# Patient Record
Sex: Female | Born: 1975
Health system: Southern US, Community
[De-identification: ages and names within clinical notes are randomized; demographics above are authoritative.]

---

## 2002-11-25 ENCOUNTER — Emergency Department (HOSPITAL_COMMUNITY): Admission: EM | Admit: 2002-11-25 | Discharge: 2002-11-25 | Payer: Self-pay | Admitting: Emergency Medicine

## 2006-01-25 ENCOUNTER — Inpatient Hospital Stay (HOSPITAL_COMMUNITY): Admission: AD | Admit: 2006-01-25 | Discharge: 2006-01-27 | Payer: Self-pay | Admitting: Obstetrics & Gynecology

## 2007-01-23 ENCOUNTER — Encounter: Admission: RE | Admit: 2007-01-23 | Discharge: 2007-01-23 | Payer: Self-pay | Admitting: Obstetrics and Gynecology

## 2008-08-12 ENCOUNTER — Encounter: Admission: RE | Admit: 2008-08-12 | Discharge: 2008-08-12 | Payer: Self-pay | Admitting: Family Medicine

## 2009-07-29 ENCOUNTER — Encounter: Admission: RE | Admit: 2009-07-29 | Discharge: 2009-07-29 | Payer: Self-pay | Admitting: Obstetrics and Gynecology

## 2010-03-16 ENCOUNTER — Inpatient Hospital Stay (HOSPITAL_COMMUNITY): Admission: AD | Admit: 2010-03-16 | Discharge: 2010-03-16 | Payer: Self-pay | Admitting: Obstetrics and Gynecology

## 2010-03-29 ENCOUNTER — Inpatient Hospital Stay (HOSPITAL_COMMUNITY): Admission: AD | Admit: 2010-03-29 | Discharge: 2010-03-31 | Payer: Self-pay | Admitting: Obstetrics and Gynecology

## 2011-01-09 LAB — CBC
HCT: 34.3 % — ABNORMAL LOW (ref 36.0–46.0)
MCHC: 33.8 g/dL (ref 30.0–36.0)
MCHC: 34.5 g/dL (ref 30.0–36.0)
MCV: 87.7 fL (ref 78.0–100.0)
MCV: 88.9 fL (ref 78.0–100.0)
Platelets: 192 10*3/uL (ref 150–400)
RBC: 3.55 MIL/uL — ABNORMAL LOW (ref 3.87–5.11)
RBC: 3.92 MIL/uL (ref 3.87–5.11)
RDW: 13.8 % (ref 11.5–15.5)
WBC: 19.9 10*3/uL — ABNORMAL HIGH (ref 4.0–10.5)

## 2015-12-01 DIAGNOSIS — L309 Dermatitis, unspecified: Secondary | ICD-10-CM | POA: Diagnosis not present

## 2015-12-01 MED FILL — predniSONE 20 MG TABS: 20 | 7 days supply | Qty: 7 | Fill #0

## 2015-12-01 MED FILL — hydrOXYzine HCL 25 MG TABS: 25 | 10 days supply | Qty: 30 | Fill #0

## 2016-01-14 DIAGNOSIS — L2089 Other atopic dermatitis: Secondary | ICD-10-CM | POA: Diagnosis not present

## 2016-01-14 MED FILL — HYDROCORTISONE VAL 0.2% OIN: 0.2 | 14 days supply | Qty: 60 | Fill #0

## 2016-03-07 DIAGNOSIS — E559 Vitamin D deficiency, unspecified: Secondary | ICD-10-CM | POA: Diagnosis not present

## 2016-03-07 DIAGNOSIS — E7800 Pure hypercholesterolemia, unspecified: Secondary | ICD-10-CM | POA: Diagnosis not present

## 2016-03-07 DIAGNOSIS — Z0000 Encounter for general adult medical examination without abnormal findings: Secondary | ICD-10-CM | POA: Diagnosis not present

## 2016-03-16 DIAGNOSIS — F419 Anxiety disorder, unspecified: Secondary | ICD-10-CM | POA: Diagnosis not present

## 2016-03-16 DIAGNOSIS — Z0000 Encounter for general adult medical examination without abnormal findings: Secondary | ICD-10-CM | POA: Diagnosis not present

## 2016-03-27 MED FILL — predniSONE 20 MG TABS: 20 | 7 days supply | Qty: 7 | Fill #0

## 2016-04-14 MED FILL — ALPRAZolam 0.25 MG TABS: 0.25 | 30 days supply | Qty: 90 | Fill #0

## 2016-06-23 DIAGNOSIS — Z6827 Body mass index (BMI) 27.0-27.9, adult: Secondary | ICD-10-CM | POA: Diagnosis not present

## 2016-06-23 DIAGNOSIS — Z124 Encounter for screening for malignant neoplasm of cervix: Secondary | ICD-10-CM | POA: Diagnosis not present

## 2016-06-23 DIAGNOSIS — Z01419 Encounter for gynecological examination (general) (routine) without abnormal findings: Secondary | ICD-10-CM | POA: Diagnosis not present

## 2016-06-23 MED FILL — ESCITALOPRAM 10 MG TABLET: 10 | 30 days supply | Qty: 30 | Fill #0

## 2016-06-28 ENCOUNTER — Encounter: Payer: Self-pay | Admitting: Genetic Counselor

## 2016-06-28 ENCOUNTER — Telehealth: Payer: Self-pay | Admitting: Genetic Counselor

## 2016-06-28 NOTE — Telephone Encounter (Signed)
Pt confirmed appt, completed intake, aware of mychart questionnaire, mailed pt letter, faxed referring provider appt date/time.

## 2016-08-02 ENCOUNTER — Encounter: Payer: 59 | Admitting: Genetic Counselor

## 2016-08-02 ENCOUNTER — Other Ambulatory Visit: Payer: 59

## 2016-08-25 ENCOUNTER — Telehealth: Payer: Self-pay | Admitting: Genetic Counselor

## 2016-08-25 NOTE — Telephone Encounter (Signed)
Returned pt call regarding scheduling appt for genetics.  Lt vm

## 2016-09-04 MED FILL — predniSONE 20 MG TABS: 20 | 7 days supply | Qty: 7 | Fill #0

## 2016-09-04 MED FILL — ALPRAZolam 0.25 MG TABS: 0.25 | 30 days supply | Qty: 90 | Fill #1

## 2016-10-04 ENCOUNTER — Other Ambulatory Visit: Payer: Self-pay | Admitting: Obstetrics and Gynecology

## 2016-10-04 DIAGNOSIS — Z1231 Encounter for screening mammogram for malignant neoplasm of breast: Secondary | ICD-10-CM

## 2016-11-09 ENCOUNTER — Ambulatory Visit: Payer: 59

## 2016-11-30 ENCOUNTER — Ambulatory Visit
Admission: RE | Admit: 2016-11-30 | Discharge: 2016-11-30 | Disposition: A | Discharge status: Home / Self Care | Payer: 59 | Source: Ambulatory Visit | Attending: Obstetrics and Gynecology | Admitting: Obstetrics and Gynecology

## 2016-11-30 DIAGNOSIS — Z1231 Encounter for screening mammogram for malignant neoplasm of breast: Secondary | ICD-10-CM | POA: Diagnosis not present

## 2016-12-01 ENCOUNTER — Ambulatory Visit: Payer: 59

## 2017-02-01 MED FILL — ALPRAZolam 0.25 MG TABS: 0.25 | 30 days supply | Qty: 90 | Fill #0

## 2017-03-26 MED FILL — predniSONE 20 MG TABS: 20 | 7 days supply | Qty: 7 | Fill #0

## 2017-05-08 DIAGNOSIS — E559 Vitamin D deficiency, unspecified: Secondary | ICD-10-CM | POA: Diagnosis not present

## 2017-05-08 DIAGNOSIS — M2550 Pain in unspecified joint: Secondary | ICD-10-CM | POA: Diagnosis not present

## 2017-05-08 DIAGNOSIS — R5383 Other fatigue: Secondary | ICD-10-CM | POA: Diagnosis not present

## 2017-07-13 MED FILL — ALPRAZolam 0.25 MG TABS: 0.25 | 30 days supply | Qty: 90 | Fill #0

## 2017-09-26 DIAGNOSIS — Z01419 Encounter for gynecological examination (general) (routine) without abnormal findings: Secondary | ICD-10-CM | POA: Diagnosis not present

## 2017-11-15 ENCOUNTER — Other Ambulatory Visit: Payer: Self-pay | Admitting: Obstetrics

## 2017-11-15 DIAGNOSIS — Z1231 Encounter for screening mammogram for malignant neoplasm of breast: Secondary | ICD-10-CM

## 2017-11-20 MED FILL — ESCITALOPRAM 10 MG TABLET: 10 | 30 days supply | Qty: 30 | Fill #0

## 2017-12-07 ENCOUNTER — Ambulatory Visit
Admission: RE | Admit: 2017-12-07 | Discharge: 2017-12-07 | Disposition: A | Discharge status: Home / Self Care | Payer: 59 | Source: Ambulatory Visit | Attending: Obstetrics | Admitting: Obstetrics

## 2017-12-07 DIAGNOSIS — Z1231 Encounter for screening mammogram for malignant neoplasm of breast: Secondary | ICD-10-CM

## 2017-12-20 MED FILL — ALPRAZolam 0.25 MG TABS: 0.25 | 30 days supply | Qty: 90 | Fill #0

## 2018-01-30 MED FILL — ESCITALOPRAM 10 MG TABLET: 10 | 30 days supply | Qty: 30 | Fill #1

## 2018-03-01 MED FILL — predniSONE 20 MG TABS: 20 | 7 days supply | Qty: 7 | Fill #0

## 2018-04-16 MED FILL — ESCITALOPRAM 10 MG TABLET: 10 | 30 days supply | Qty: 30 | Fill #2

## 2018-04-17 DIAGNOSIS — Z0000 Encounter for general adult medical examination without abnormal findings: Secondary | ICD-10-CM | POA: Diagnosis not present

## 2018-04-17 DIAGNOSIS — E7800 Pure hypercholesterolemia, unspecified: Secondary | ICD-10-CM | POA: Diagnosis not present

## 2018-04-17 DIAGNOSIS — E559 Vitamin D deficiency, unspecified: Secondary | ICD-10-CM | POA: Diagnosis not present

## 2018-04-18 DIAGNOSIS — Z0000 Encounter for general adult medical examination without abnormal findings: Secondary | ICD-10-CM | POA: Diagnosis not present

## 2018-04-18 DIAGNOSIS — Z23 Encounter for immunization: Secondary | ICD-10-CM | POA: Diagnosis not present

## 2018-05-13 MED FILL — ALPRAZolam 0.25 MG TABS: 0.25 | 30 days supply | Qty: 90 | Fill #0

## 2018-06-14 MED FILL — predniSONE 20 MG TABS: 20 | 7 days supply | Qty: 7 | Fill #0

## 2018-06-25 MED FILL — ESCITALOPRAM 10 MG TABLET: 10 | 30 days supply | Qty: 30 | Fill #3

## 2018-08-29 MED FILL — ESCITALOPRAM 10 MG TABLET: 10 | 30 days supply | Qty: 30 | Fill #0

## 2018-09-12 MED FILL — ALPRAZolam 0.25 MG TABS: 0.25 | 30 days supply | Qty: 90 | Fill #0

## 2018-11-14 ENCOUNTER — Other Ambulatory Visit: Payer: Self-pay | Admitting: Obstetrics

## 2018-11-14 DIAGNOSIS — Z1231 Encounter for screening mammogram for malignant neoplasm of breast: Secondary | ICD-10-CM

## 2018-11-22 DIAGNOSIS — Z79899 Other long term (current) drug therapy: Secondary | ICD-10-CM | POA: Diagnosis not present

## 2018-11-22 DIAGNOSIS — M79604 Pain in right leg: Secondary | ICD-10-CM | POA: Diagnosis not present

## 2018-11-22 DIAGNOSIS — R202 Paresthesia of skin: Secondary | ICD-10-CM | POA: Diagnosis not present

## 2018-11-22 DIAGNOSIS — R635 Abnormal weight gain: Secondary | ICD-10-CM | POA: Diagnosis not present

## 2018-11-22 DIAGNOSIS — Z23 Encounter for immunization: Secondary | ICD-10-CM | POA: Diagnosis not present

## 2018-11-22 DIAGNOSIS — Z5181 Encounter for therapeutic drug level monitoring: Secondary | ICD-10-CM | POA: Diagnosis not present

## 2018-11-22 DIAGNOSIS — F419 Anxiety disorder, unspecified: Secondary | ICD-10-CM | POA: Diagnosis not present

## 2018-11-22 DIAGNOSIS — M79605 Pain in left leg: Secondary | ICD-10-CM | POA: Diagnosis not present

## 2018-11-29 DIAGNOSIS — Z01419 Encounter for gynecological examination (general) (routine) without abnormal findings: Secondary | ICD-10-CM | POA: Diagnosis not present

## 2018-11-29 MED FILL — ESCITALOPRAM 10 MG TABLET: 10 | 90 days supply | Qty: 90 | Fill #0

## 2018-11-29 MED FILL — LARIN 21 1-20 TABLET: 1-20 | 84 days supply | Qty: 84 | Fill #0

## 2018-12-05 DIAGNOSIS — Z0389 Encounter for observation for other suspected diseases and conditions ruled out: Secondary | ICD-10-CM | POA: Diagnosis not present

## 2018-12-05 DIAGNOSIS — M79661 Pain in right lower leg: Secondary | ICD-10-CM | POA: Diagnosis not present

## 2018-12-06 MED FILL — ALPRAZolam 0.25 MG TABS: 0.25 | 30 days supply | Qty: 30 | Fill #0 | Status: TO

## 2018-12-17 ENCOUNTER — Ambulatory Visit
Admission: RE | Admit: 2018-12-17 | Discharge: 2018-12-17 | Disposition: A | Discharge status: Home / Self Care | Payer: 59 | Source: Ambulatory Visit | Attending: Obstetrics | Admitting: Obstetrics

## 2018-12-17 DIAGNOSIS — Z1231 Encounter for screening mammogram for malignant neoplasm of breast: Secondary | ICD-10-CM | POA: Diagnosis not present

## 2019-01-31 DIAGNOSIS — J029 Acute pharyngitis, unspecified: Secondary | ICD-10-CM | POA: Diagnosis not present

## 2019-01-31 DIAGNOSIS — J069 Acute upper respiratory infection, unspecified: Secondary | ICD-10-CM | POA: Diagnosis not present

## 2019-02-03 MED FILL — ALPRAZolam 0.25 MG TABS: 0.25 | 30 days supply | Qty: 30 | Fill #0

## 2019-03-19 MED FILL — ALPRAZolam 0.25 MG TABS: 0.25 | 30 days supply | Qty: 30 | Fill #1

## 2019-04-14 DIAGNOSIS — Z0000 Encounter for general adult medical examination without abnormal findings: Secondary | ICD-10-CM | POA: Diagnosis not present

## 2019-04-23 DIAGNOSIS — F419 Anxiety disorder, unspecified: Secondary | ICD-10-CM | POA: Diagnosis not present

## 2019-04-23 DIAGNOSIS — Z0000 Encounter for general adult medical examination without abnormal findings: Secondary | ICD-10-CM | POA: Diagnosis not present

## 2019-04-23 DIAGNOSIS — Z7189 Other specified counseling: Secondary | ICD-10-CM | POA: Diagnosis not present

## 2019-04-23 DIAGNOSIS — E559 Vitamin D deficiency, unspecified: Secondary | ICD-10-CM | POA: Diagnosis not present

## 2019-04-23 MED FILL — ESCITALOPRAM 10 MG TABLET: 10 | 90 days supply | Qty: 180 | Fill #0

## 2019-06-06 MED FILL — ALPRAZolam 0.25 MG TABS: 0.25 | 30 days supply | Qty: 30 | Fill #0

## 2019-07-21 MED FILL — ALPRAZolam 0.25 MG TABS: 0.25 | 30 days supply | Qty: 30 | Fill #1

## 2019-09-11 MED FILL — ALPRAZolam 0.25 MG TABS: 0.25 | 30 days supply | Qty: 30 | Fill #0

## 2019-09-13 IMAGING — MG DIGITAL SCREENING BILATERAL MAMMOGRAM WITH TOMO AND CAD
8 series · 8 of 24 positions shown · non-contrast
Comparison: Previous exam(s).

CLINICAL DATA: Screening.

EXAM:
DIGITAL SCREENING BILATERAL MAMMOGRAM WITH TOMO AND CAD

[L CC synth-2D]
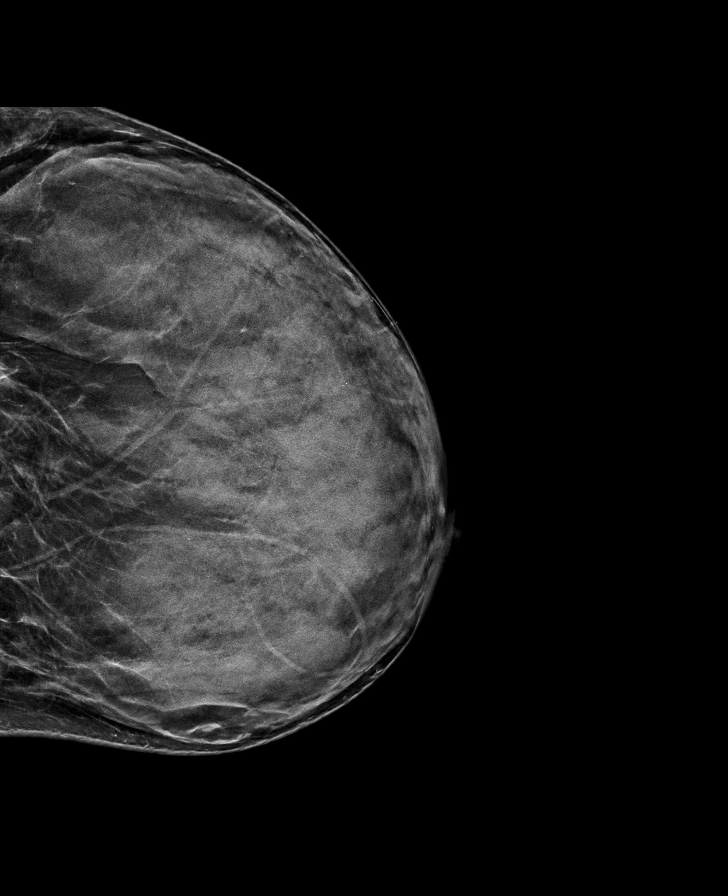

[R MLO synth-2D]
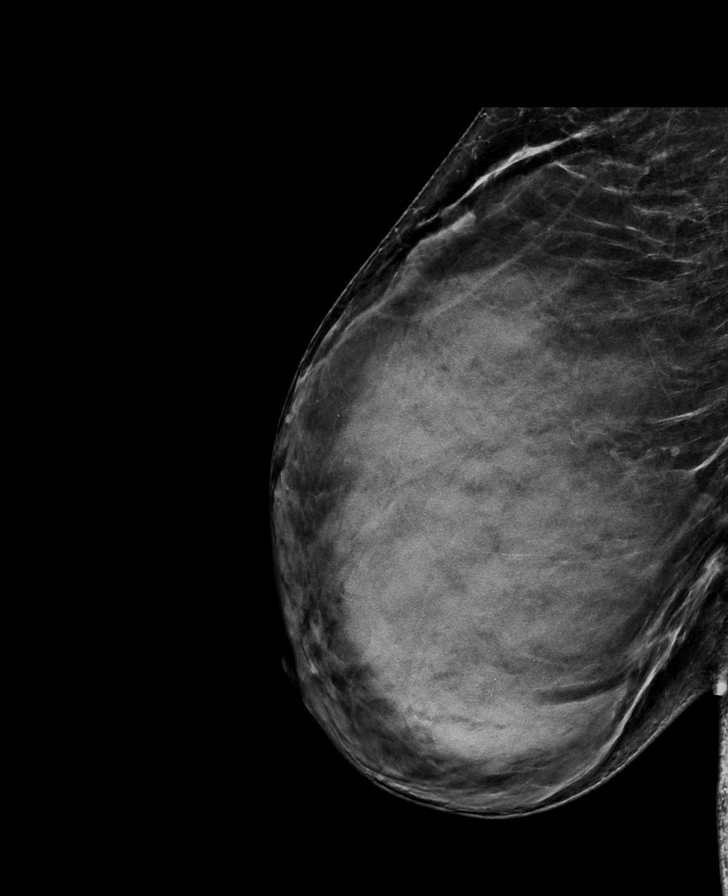

[L MLO synth-2D]
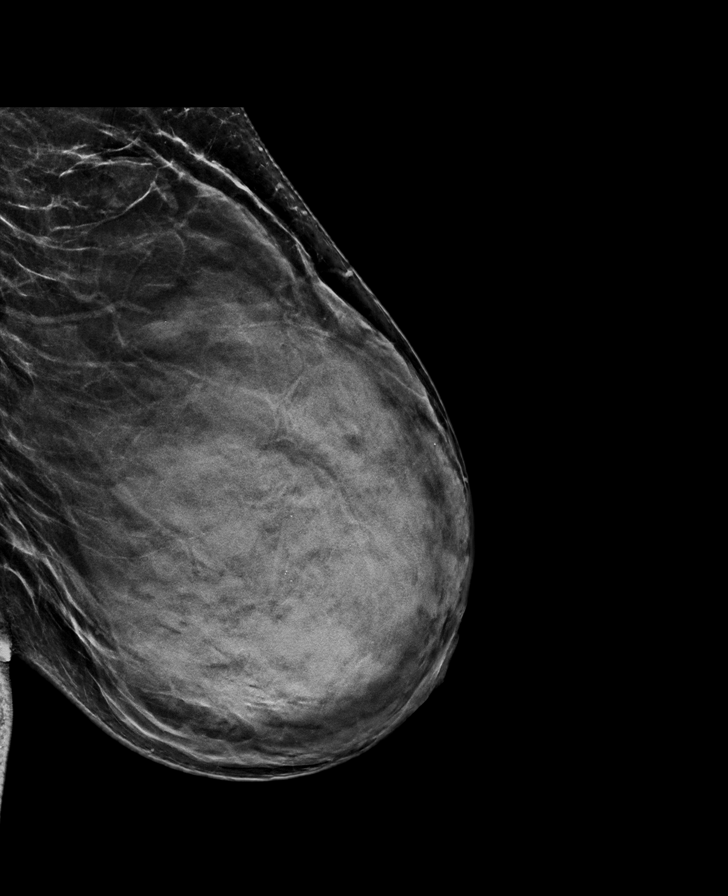

[R CC synth-2D]
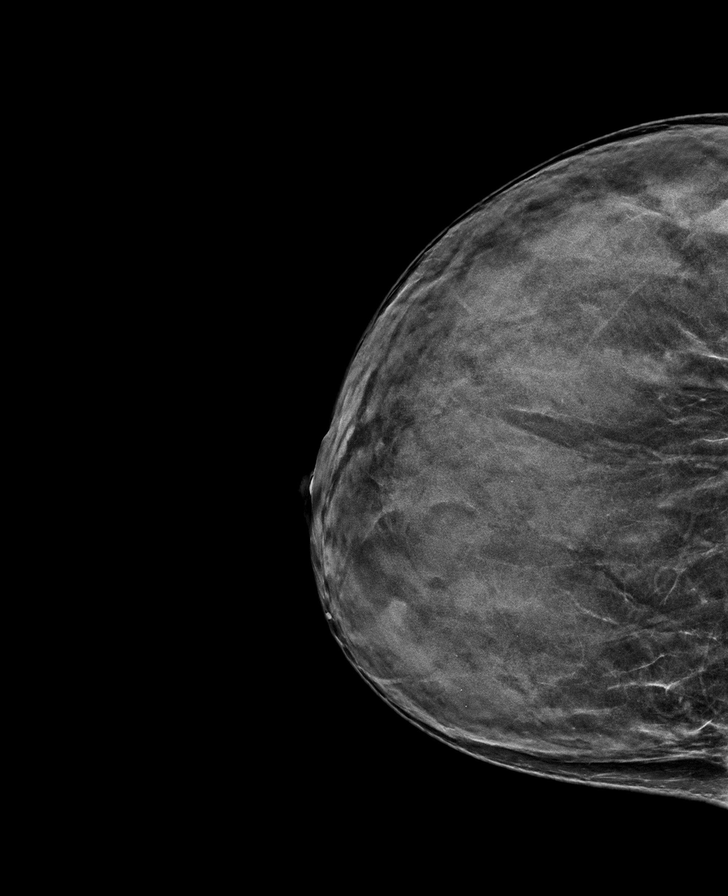

[L CC tomo · tomo slice 33/65.0]
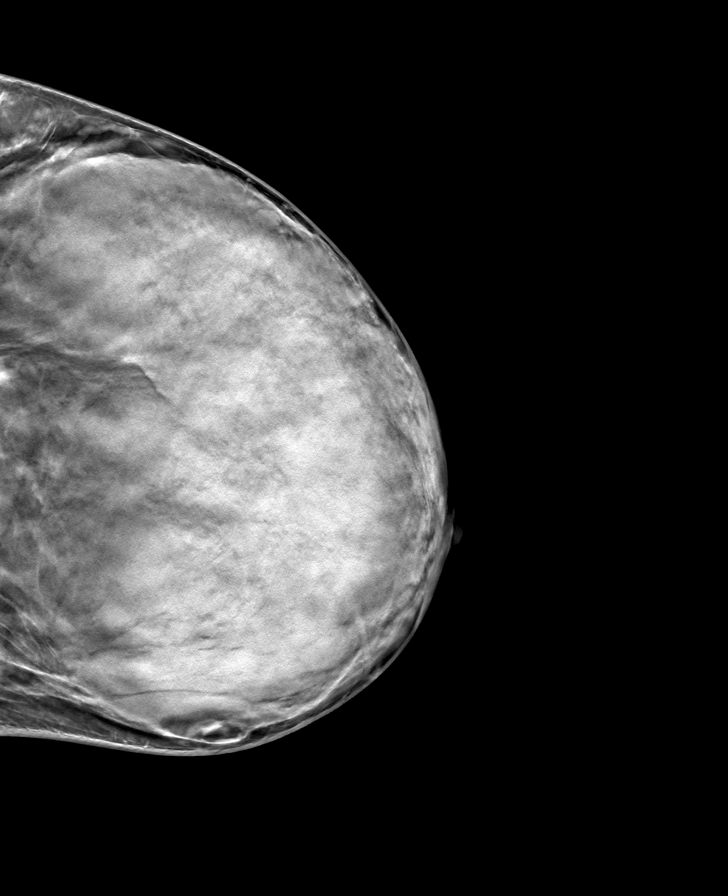

[R MLO tomo · tomo slice 39/76.0]
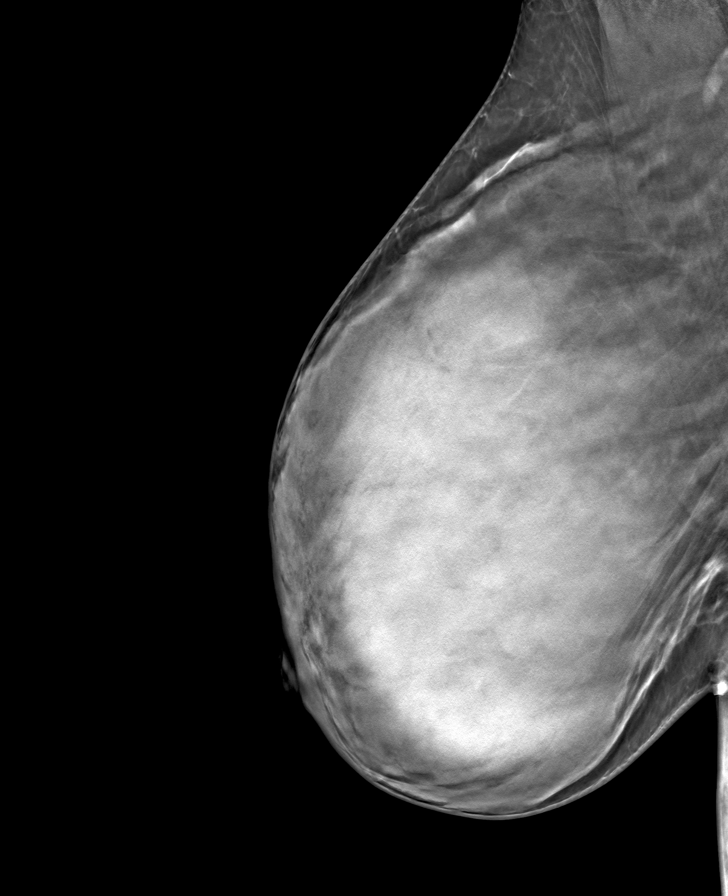

[R CC tomo · tomo slice 33/64.0]
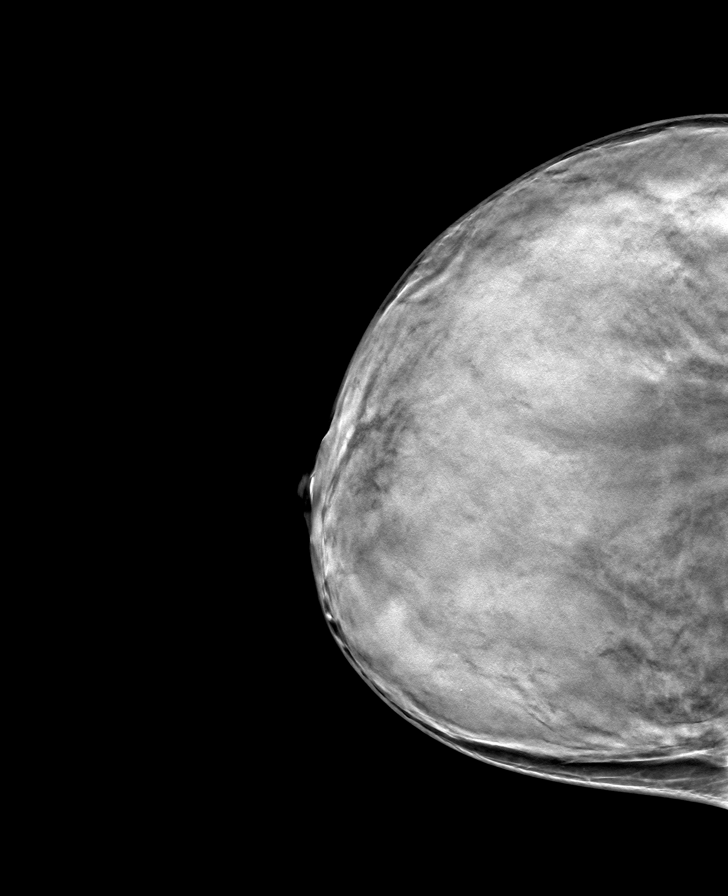

[L MLO tomo · tomo slice 35/69.0]
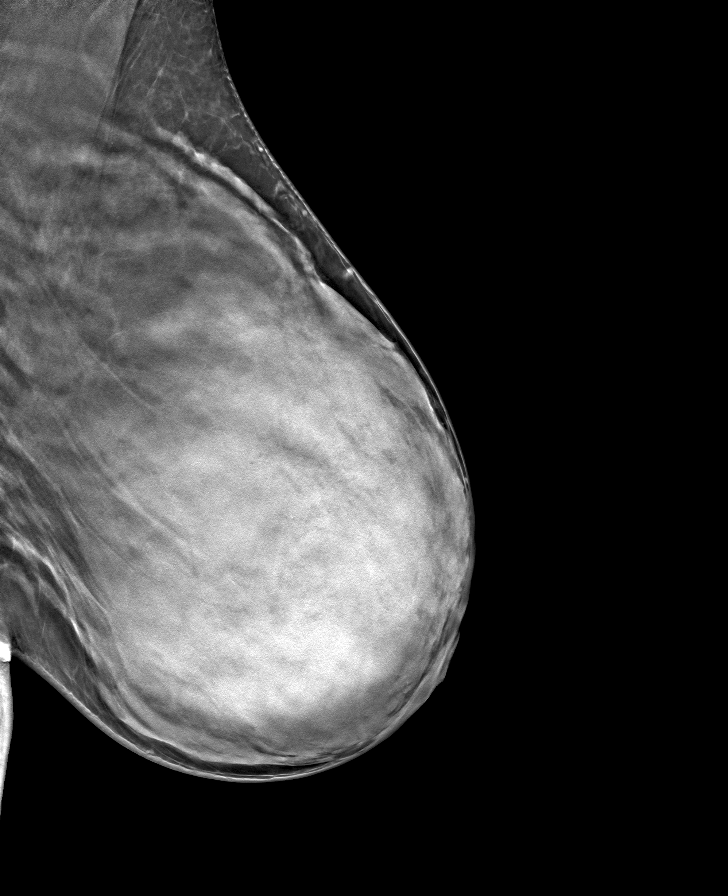

[8 of 24 positions shown; findings below may reference images not displayed]

ACR Breast Density Category d: The breast tissue is extremely dense,
which lowers the sensitivity of mammography
FINDINGS: There are no findings suspicious for malignancy. Images were
processed with CAD.
IMPRESSION: No mammographic evidence of malignancy. A result letter of this
screening mammogram will be mailed directly to the patient.

RECOMMENDATION:
Screening mammogram in one year. (Code:[id number])

BI-RADS CATEGORY  1: Negative.

## 2019-09-22 MED FILL — ALPRAZolam 0.25 MG TABS: 0.25 | 30 days supply | Qty: 30 | Fill #0

## 2019-10-09 DIAGNOSIS — M791 Myalgia, unspecified site: Secondary | ICD-10-CM | POA: Diagnosis not present

## 2019-10-14 DIAGNOSIS — M25511 Pain in right shoulder: Secondary | ICD-10-CM | POA: Diagnosis not present

## 2019-10-14 DIAGNOSIS — R202 Paresthesia of skin: Secondary | ICD-10-CM | POA: Diagnosis not present

## 2019-10-14 DIAGNOSIS — M791 Myalgia, unspecified site: Secondary | ICD-10-CM | POA: Diagnosis not present

## 2019-10-14 DIAGNOSIS — M542 Cervicalgia: Secondary | ICD-10-CM | POA: Diagnosis not present

## 2019-10-22 ENCOUNTER — Ambulatory Visit: Payer: 59 | Attending: Internal Medicine

## 2019-10-22 DIAGNOSIS — Z20822 Contact with and (suspected) exposure to covid-19: Secondary | ICD-10-CM

## 2019-10-22 DIAGNOSIS — Z20828 Contact with and (suspected) exposure to other viral communicable diseases: Secondary | ICD-10-CM | POA: Diagnosis not present

## 2019-10-23 LAB — NOVEL CORONAVIRUS, NAA: SARS-CoV-2, NAA: NOT DETECTED

## 2019-11-13 MED FILL — ESCITALOPRAM 10 MG TABLET: 10 | 90 days supply | Qty: 180 | Fill #1

## 2019-11-27 ENCOUNTER — Other Ambulatory Visit: Payer: Self-pay | Admitting: Obstetrics

## 2019-11-27 DIAGNOSIS — Z1231 Encounter for screening mammogram for malignant neoplasm of breast: Secondary | ICD-10-CM

## 2019-12-09 MED FILL — ALPRAZolam 0.25 MG TABS: 0.25 | 30 days supply | Qty: 30 | Fill #0

## 2019-12-25 ENCOUNTER — Ambulatory Visit: Payer: 59 | Attending: Family

## 2019-12-25 DIAGNOSIS — Z23 Encounter for immunization: Secondary | ICD-10-CM | POA: Insufficient documentation

## 2019-12-25 NOTE — Progress Notes (Signed)
   Covid-19 Vaccination Clinic  Name:  Bridget Kane    MRN: [id number] DOB: [birth year]  12/25/2019  Bridget Kane was observed post Covid-19 immunization for 15 minutes without incident. She was provided with Vaccine Information Sheet and instruction to access the V-Safe system.   Bridget Kane was instructed to call 911 with any severe reactions post vaccine: Marland Kitchen Difficulty breathing  . Swelling of face and throat  . A fast heartbeat  . A bad rash all over body  . Dizziness and weakness   Immunizations Administered    Name Date Dose VIS Date Route   Moderna COVID-19 Vaccine 12/25/2019  1:46 PM 0.5 mL 09/23/2019 Intramuscular   Manufacturer: Moderna   Lot: [id number]   [place]: [id number]

## 2020-01-06 ENCOUNTER — Ambulatory Visit
Admission: RE | Admit: 2020-01-06 | Discharge: 2020-01-06 | Disposition: A | Discharge status: Home / Self Care | Payer: 59 | Source: Ambulatory Visit | Attending: Obstetrics | Admitting: Obstetrics

## 2020-01-06 ENCOUNTER — Other Ambulatory Visit: Payer: Self-pay

## 2020-01-06 DIAGNOSIS — Z1231 Encounter for screening mammogram for malignant neoplasm of breast: Secondary | ICD-10-CM | POA: Diagnosis not present

## 2020-01-27 ENCOUNTER — Ambulatory Visit: Payer: 59 | Attending: Family

## 2020-01-27 DIAGNOSIS — Z23 Encounter for immunization: Secondary | ICD-10-CM

## 2020-01-27 NOTE — Progress Notes (Signed)
   Covid-19 Vaccination Clinic  Name:  Bridget Kane    MRN: [id number] DOB: [birth year]  01/27/2020  Bridget Kane was observed post Covid-19 immunization for 15 minutes without incident. She was provided with Vaccine Information Sheet and instruction to access the V-Safe system.   Bridget Kane was instructed to call 911 with any severe reactions post vaccine: Marland Kitchen Difficulty breathing  . Swelling of face and throat  . A fast heartbeat  . A bad rash all over body  . Dizziness and weakness   Immunizations Administered    Name Date Dose VIS Date Route   Moderna COVID-19 Vaccine 01/27/2020  9:38 AM 0.5 mL 09/23/2019 Intramuscular   Manufacturer: Moderna   Lot: [id number]   [place]: [id number]

## 2020-02-12 ENCOUNTER — Other Ambulatory Visit: Payer: Self-pay | Admitting: Obstetrics

## 2020-02-12 DIAGNOSIS — Z01419 Encounter for gynecological examination (general) (routine) without abnormal findings: Secondary | ICD-10-CM | POA: Diagnosis not present

## 2020-02-12 DIAGNOSIS — N6310 Unspecified lump in the right breast, unspecified quadrant: Secondary | ICD-10-CM

## 2020-02-24 ENCOUNTER — Other Ambulatory Visit: Payer: Self-pay

## 2020-02-24 ENCOUNTER — Ambulatory Visit
Admission: RE | Admit: 2020-02-24 | Discharge: 2020-02-24 | Disposition: A | Discharge status: Home / Self Care | Payer: 59 | Source: Ambulatory Visit | Attending: Obstetrics | Admitting: Obstetrics

## 2020-02-24 DIAGNOSIS — N6310 Unspecified lump in the right breast, unspecified quadrant: Secondary | ICD-10-CM

## 2020-02-24 DIAGNOSIS — R922 Inconclusive mammogram: Secondary | ICD-10-CM | POA: Diagnosis not present

## 2020-02-24 DIAGNOSIS — N6001 Solitary cyst of right breast: Secondary | ICD-10-CM | POA: Diagnosis not present

## 2020-05-25 MED FILL — ALPRAZolam 0.25 MG TABS: 0.25 | 30 days supply | Qty: 30 | Fill #0

## 2020-05-26 DIAGNOSIS — Z0000 Encounter for general adult medical examination without abnormal findings: Secondary | ICD-10-CM | POA: Diagnosis not present

## 2020-05-26 DIAGNOSIS — E559 Vitamin D deficiency, unspecified: Secondary | ICD-10-CM | POA: Diagnosis not present

## 2020-06-01 DIAGNOSIS — F419 Anxiety disorder, unspecified: Secondary | ICD-10-CM | POA: Diagnosis not present

## 2020-06-01 DIAGNOSIS — Z0000 Encounter for general adult medical examination without abnormal findings: Secondary | ICD-10-CM | POA: Diagnosis not present

## 2020-06-01 DIAGNOSIS — R7309 Other abnormal glucose: Secondary | ICD-10-CM | POA: Diagnosis not present

## 2020-06-01 DIAGNOSIS — R634 Abnormal weight loss: Secondary | ICD-10-CM | POA: Diagnosis not present

## 2020-06-01 DIAGNOSIS — E559 Vitamin D deficiency, unspecified: Secondary | ICD-10-CM | POA: Diagnosis not present

## 2020-07-15 MED FILL — predniSONE 20 MG TABS: 20 | 7 days supply | Qty: 7 | Fill #0

## 2020-07-22 MED FILL — ALPRAZolam 0.25 MG TABS: 0.25 | 30 days supply | Qty: 30 | Fill #0

## 2020-08-25 ENCOUNTER — Other Ambulatory Visit (HOSPITAL_COMMUNITY): Payer: Self-pay | Admitting: Family Medicine

## 2020-08-25 MED FILL — ESCITALOPRAM 10 MG TABLET: 10 | 90 days supply | Qty: 90 | Fill #0

## 2020-09-29 MED FILL — ALPRAZolam 0.25 MG TABS: 0.25 | 30 days supply | Qty: 30 | Fill #1

## 2020-10-27 ENCOUNTER — Other Ambulatory Visit (HOSPITAL_COMMUNITY): Payer: Self-pay | Admitting: Family Medicine

## 2020-10-27 DIAGNOSIS — J020 Streptococcal pharyngitis: Secondary | ICD-10-CM | POA: Diagnosis not present

## 2020-10-27 DIAGNOSIS — J029 Acute pharyngitis, unspecified: Secondary | ICD-10-CM | POA: Diagnosis not present

## 2020-10-27 MED FILL — AMOXICILLIN 875 MG TABS: 875 | 10 days supply | Qty: 20 | Fill #0

## 2020-12-29 ENCOUNTER — Other Ambulatory Visit (HOSPITAL_COMMUNITY): Payer: Self-pay | Admitting: Family Medicine

## 2020-12-29 MED FILL — ALPRAZolam 0.25 MG TABS: 0.25 | 30 days supply | Qty: 30 | Fill #0

## 2021-03-10 DIAGNOSIS — Z01419 Encounter for gynecological examination (general) (routine) without abnormal findings: Secondary | ICD-10-CM | POA: Diagnosis not present

## 2021-03-10 DIAGNOSIS — Z124 Encounter for screening for malignant neoplasm of cervix: Secondary | ICD-10-CM | POA: Diagnosis not present

## 2021-03-15 ENCOUNTER — Other Ambulatory Visit (HOSPITAL_COMMUNITY): Payer: Self-pay

## 2021-03-15 MED FILL — Alprazolam Tab 0.25 MG: ORAL | 30 days supply | Qty: 30 | Fill #0 | Status: AC

## 2021-03-18 ENCOUNTER — Other Ambulatory Visit (HOSPITAL_COMMUNITY): Payer: Self-pay

## 2021-03-25 DIAGNOSIS — Z23 Encounter for immunization: Secondary | ICD-10-CM | POA: Diagnosis not present

## 2021-03-28 ENCOUNTER — Other Ambulatory Visit (HOSPITAL_COMMUNITY): Payer: Self-pay

## 2021-03-28 MED ORDER — AMOXICILLIN 500 MG PO CAPS
ORAL_CAPSULE | ORAL | 1 refills | Status: AC
  Filled 2021-03-28: qty 30, 10d supply, fill #0

## 2021-04-15 ENCOUNTER — Other Ambulatory Visit (HOSPITAL_COMMUNITY): Payer: Self-pay

## 2021-04-15 MED FILL — Escitalopram Oxalate Tab 10 MG (Base Equiv): ORAL | 90 days supply | Qty: 90 | Fill #0 | Status: AC

## 2021-04-18 ENCOUNTER — Other Ambulatory Visit (HOSPITAL_COMMUNITY): Payer: Self-pay

## 2021-04-18 MED ORDER — ALPRAZOLAM 0.25 MG PO TABS
0.2500 mg | ORAL_TABLET | Freq: Every day | ORAL | 1 refills | Status: DC
  Filled 2021-04-18: qty 30, 30d supply, fill #0
  Filled 2021-07-05: qty 30, 30d supply, fill #1

## 2021-04-19 ENCOUNTER — Other Ambulatory Visit (HOSPITAL_COMMUNITY): Payer: Self-pay

## 2021-07-05 ENCOUNTER — Other Ambulatory Visit (HOSPITAL_COMMUNITY): Payer: Self-pay

## 2021-07-08 ENCOUNTER — Other Ambulatory Visit (HOSPITAL_COMMUNITY): Payer: Self-pay

## 2021-08-01 ENCOUNTER — Other Ambulatory Visit (HOSPITAL_COMMUNITY): Payer: Self-pay

## 2021-08-01 MED ORDER — ESCITALOPRAM OXALATE 10 MG PO TABS
20.0000 mg | ORAL_TABLET | Freq: Every day | ORAL | 0 refills | Status: DC
  Filled 2021-08-01: qty 180, 90d supply, fill #0

## 2021-08-23 ENCOUNTER — Other Ambulatory Visit (HOSPITAL_COMMUNITY): Payer: Self-pay

## 2021-09-09 DIAGNOSIS — R634 Abnormal weight loss: Secondary | ICD-10-CM | POA: Diagnosis not present

## 2021-09-09 DIAGNOSIS — R7309 Other abnormal glucose: Secondary | ICD-10-CM | POA: Diagnosis not present

## 2021-09-09 DIAGNOSIS — E559 Vitamin D deficiency, unspecified: Secondary | ICD-10-CM | POA: Diagnosis not present

## 2021-09-09 DIAGNOSIS — Z0000 Encounter for general adult medical examination without abnormal findings: Secondary | ICD-10-CM | POA: Diagnosis not present

## 2021-09-23 ENCOUNTER — Other Ambulatory Visit (HOSPITAL_COMMUNITY): Payer: Self-pay

## 2021-09-23 DIAGNOSIS — Z1211 Encounter for screening for malignant neoplasm of colon: Secondary | ICD-10-CM | POA: Diagnosis not present

## 2021-09-23 DIAGNOSIS — Z23 Encounter for immunization: Secondary | ICD-10-CM | POA: Diagnosis not present

## 2021-09-23 DIAGNOSIS — F419 Anxiety disorder, unspecified: Secondary | ICD-10-CM | POA: Diagnosis not present

## 2021-09-23 DIAGNOSIS — R7309 Other abnormal glucose: Secondary | ICD-10-CM | POA: Diagnosis not present

## 2021-09-23 DIAGNOSIS — Z0000 Encounter for general adult medical examination without abnormal findings: Secondary | ICD-10-CM | POA: Diagnosis not present

## 2021-09-23 DIAGNOSIS — E559 Vitamin D deficiency, unspecified: Secondary | ICD-10-CM | POA: Diagnosis not present

## 2021-09-23 DIAGNOSIS — R7989 Other specified abnormal findings of blood chemistry: Secondary | ICD-10-CM | POA: Diagnosis not present

## 2021-09-23 MED ORDER — ALPRAZOLAM 0.25 MG PO TABS
0.2500 mg | ORAL_TABLET | Freq: Every day | ORAL | 2 refills | Status: DC
  Filled 2021-09-23: qty 30, 30d supply, fill #0
  Filled 2021-11-25: qty 30, 30d supply, fill #1
  Filled 2022-02-17: qty 30, 30d supply, fill #2

## 2021-10-09 DIAGNOSIS — Z1211 Encounter for screening for malignant neoplasm of colon: Secondary | ICD-10-CM | POA: Diagnosis not present

## 2021-10-09 DIAGNOSIS — Z1212 Encounter for screening for malignant neoplasm of rectum: Secondary | ICD-10-CM | POA: Diagnosis not present

## 2021-11-25 ENCOUNTER — Other Ambulatory Visit (HOSPITAL_COMMUNITY): Payer: Self-pay

## 2021-12-27 ENCOUNTER — Other Ambulatory Visit: Payer: Self-pay | Admitting: Obstetrics

## 2021-12-27 DIAGNOSIS — Z1231 Encounter for screening mammogram for malignant neoplasm of breast: Secondary | ICD-10-CM

## 2022-01-06 ENCOUNTER — Ambulatory Visit
Admission: RE | Admit: 2022-01-06 | Discharge: 2022-01-06 | Disposition: A | Discharge status: Home / Self Care | Payer: 59 | Source: Ambulatory Visit | Attending: Obstetrics | Admitting: Obstetrics

## 2022-01-06 DIAGNOSIS — Z1231 Encounter for screening mammogram for malignant neoplasm of breast: Secondary | ICD-10-CM | POA: Diagnosis not present

## 2022-01-09 ENCOUNTER — Other Ambulatory Visit: Payer: Self-pay | Admitting: Obstetrics

## 2022-01-09 DIAGNOSIS — R928 Other abnormal and inconclusive findings on diagnostic imaging of breast: Secondary | ICD-10-CM

## 2022-01-16 ENCOUNTER — Ambulatory Visit
Admission: RE | Admit: 2022-01-16 | Discharge: 2022-01-16 | Disposition: A | Discharge status: Home / Self Care | Payer: 59 | Source: Ambulatory Visit | Attending: Obstetrics | Admitting: Obstetrics

## 2022-01-16 DIAGNOSIS — R922 Inconclusive mammogram: Secondary | ICD-10-CM | POA: Diagnosis not present

## 2022-01-16 DIAGNOSIS — R928 Other abnormal and inconclusive findings on diagnostic imaging of breast: Secondary | ICD-10-CM

## 2022-01-16 DIAGNOSIS — N6011 Diffuse cystic mastopathy of right breast: Secondary | ICD-10-CM | POA: Diagnosis not present

## 2022-02-17 ENCOUNTER — Other Ambulatory Visit (HOSPITAL_COMMUNITY): Payer: Self-pay

## 2022-02-17 MED ORDER — ESCITALOPRAM OXALATE 10 MG PO TABS
20.0000 mg | ORAL_TABLET | Freq: Every day | ORAL | 1 refills | Status: AC
  Filled 2022-02-17: qty 180, 90d supply, fill #0

## 2022-03-09 ENCOUNTER — Other Ambulatory Visit (HOSPITAL_COMMUNITY): Payer: Self-pay

## 2022-03-09 MED ORDER — PREDNISONE 20 MG PO TABS
20.0000 mg | ORAL_TABLET | Freq: Every day | ORAL | 0 refills | Status: AC
  Filled 2022-03-09: qty 7, 7d supply, fill #0

## 2022-03-10 DIAGNOSIS — Z01419 Encounter for gynecological examination (general) (routine) without abnormal findings: Secondary | ICD-10-CM | POA: Diagnosis not present

## 2022-04-07 ENCOUNTER — Other Ambulatory Visit (HOSPITAL_COMMUNITY): Payer: Self-pay

## 2022-04-07 DIAGNOSIS — K588 Other irritable bowel syndrome: Secondary | ICD-10-CM | POA: Diagnosis not present

## 2022-04-07 MED ORDER — DICYCLOMINE HCL 20 MG PO TABS
20.0000 mg | ORAL_TABLET | Freq: Four times a day (QID) | ORAL | 0 refills | Status: AC | PRN
  Filled 2022-04-07: qty 20, 5d supply, fill #0

## 2022-04-10 ENCOUNTER — Other Ambulatory Visit (HOSPITAL_COMMUNITY): Payer: Self-pay

## 2022-04-10 MED ORDER — ALPRAZOLAM 0.25 MG PO TABS
0.2500 mg | ORAL_TABLET | Freq: Every day | ORAL | 2 refills | Status: AC
  Filled 2022-04-10: qty 30, 30d supply, fill #0
  Filled 2022-06-07: qty 30, 30d supply, fill #1
  Filled 2022-07-25: qty 30, 30d supply, fill #2

## 2022-06-07 ENCOUNTER — Other Ambulatory Visit (HOSPITAL_COMMUNITY): Payer: Self-pay

## 2022-06-28 ENCOUNTER — Other Ambulatory Visit (HOSPITAL_COMMUNITY): Payer: Self-pay

## 2022-07-25 ENCOUNTER — Other Ambulatory Visit (HOSPITAL_COMMUNITY): Payer: Self-pay

## 2022-09-12 ENCOUNTER — Other Ambulatory Visit (HOSPITAL_COMMUNITY): Payer: Self-pay

## 2022-09-12 MED ORDER — ALPRAZOLAM 0.25 MG PO TABS
0.2500 mg | ORAL_TABLET | Freq: Every day | ORAL | 0 refills | Status: AC | PRN
Start: 2022-09-12 — End: ?
  Filled 2022-09-12: qty 30, 30d supply, fill #0

## 2022-09-19 DIAGNOSIS — R7309 Other abnormal glucose: Secondary | ICD-10-CM | POA: Diagnosis not present

## 2022-09-19 DIAGNOSIS — Z0000 Encounter for general adult medical examination without abnormal findings: Secondary | ICD-10-CM | POA: Diagnosis not present

## 2022-09-19 DIAGNOSIS — E559 Vitamin D deficiency, unspecified: Secondary | ICD-10-CM | POA: Diagnosis not present

## 2022-09-19 DIAGNOSIS — R634 Abnormal weight loss: Secondary | ICD-10-CM | POA: Diagnosis not present

## 2022-09-26 DIAGNOSIS — R7989 Other specified abnormal findings of blood chemistry: Secondary | ICD-10-CM | POA: Diagnosis not present

## 2022-09-26 DIAGNOSIS — R7309 Other abnormal glucose: Secondary | ICD-10-CM | POA: Diagnosis not present

## 2022-09-26 DIAGNOSIS — E559 Vitamin D deficiency, unspecified: Secondary | ICD-10-CM | POA: Diagnosis not present

## 2022-09-26 DIAGNOSIS — F419 Anxiety disorder, unspecified: Secondary | ICD-10-CM | POA: Diagnosis not present

## 2022-09-26 DIAGNOSIS — E7800 Pure hypercholesterolemia, unspecified: Secondary | ICD-10-CM | POA: Diagnosis not present

## 2022-09-26 DIAGNOSIS — Z23 Encounter for immunization: Secondary | ICD-10-CM | POA: Diagnosis not present

## 2022-09-26 DIAGNOSIS — Z0000 Encounter for general adult medical examination without abnormal findings: Secondary | ICD-10-CM | POA: Diagnosis not present

## 2022-09-26 DIAGNOSIS — Z79899 Other long term (current) drug therapy: Secondary | ICD-10-CM | POA: Diagnosis not present

## 2023-01-30 ENCOUNTER — Other Ambulatory Visit (HOSPITAL_COMMUNITY): Payer: Self-pay

## 2023-01-30 MED ORDER — ALPRAZOLAM 0.25 MG PO TABS
0.2500 mg | ORAL_TABLET | Freq: Every day | ORAL | 0 refills | Status: AC
  Filled 2023-01-30: qty 30, 30d supply, fill #0

## 2023-02-22 ENCOUNTER — Other Ambulatory Visit: Payer: Self-pay | Admitting: Obstetrics

## 2023-02-22 DIAGNOSIS — Z1231 Encounter for screening mammogram for malignant neoplasm of breast: Secondary | ICD-10-CM

## 2023-02-26 ENCOUNTER — Ambulatory Visit
Admission: RE | Admit: 2023-02-26 | Discharge: 2023-02-26 | Disposition: A | Discharge status: Home / Self Care | Payer: Commercial Managed Care - PPO | Source: Ambulatory Visit | Attending: Obstetrics | Admitting: Obstetrics

## 2023-02-26 DIAGNOSIS — Z1231 Encounter for screening mammogram for malignant neoplasm of breast: Secondary | ICD-10-CM | POA: Diagnosis not present

## 2023-04-12 ENCOUNTER — Other Ambulatory Visit (HOSPITAL_COMMUNITY): Payer: Self-pay

## 2023-04-12 MED ORDER — ESCITALOPRAM OXALATE 10 MG PO TABS
20.0000 mg | ORAL_TABLET | Freq: Every day | ORAL | 2 refills | Status: AC
  Filled 2023-04-12: qty 60, 30d supply, fill #0
  Filled 2023-08-21: qty 90, 45d supply, fill #0
  Filled 2024-04-11: qty 90, 45d supply, fill #1

## 2023-04-16 ENCOUNTER — Other Ambulatory Visit (HOSPITAL_COMMUNITY): Payer: Self-pay

## 2023-04-24 ENCOUNTER — Other Ambulatory Visit (HOSPITAL_COMMUNITY): Payer: Self-pay

## 2023-05-16 ENCOUNTER — Other Ambulatory Visit (HOSPITAL_COMMUNITY): Payer: Self-pay

## 2023-05-16 MED ORDER — ALPRAZOLAM 0.25 MG PO TABS
0.2500 mg | ORAL_TABLET | Freq: Every day | ORAL | 0 refills | Status: AC | PRN
  Filled 2023-05-16: qty 30, 30d supply, fill #0

## 2023-08-21 ENCOUNTER — Other Ambulatory Visit (HOSPITAL_COMMUNITY): Payer: Self-pay

## 2023-10-02 ENCOUNTER — Other Ambulatory Visit (HOSPITAL_COMMUNITY): Payer: Self-pay

## 2023-10-02 MED ORDER — ALPRAZOLAM 0.25 MG PO TABS
0.2500 mg | ORAL_TABLET | Freq: Every day | ORAL | 0 refills | Status: AC | PRN
  Filled 2023-10-02: qty 30, 30d supply, fill #0

## 2024-01-09 ENCOUNTER — Other Ambulatory Visit (HOSPITAL_COMMUNITY): Payer: Self-pay

## 2024-01-09 MED ORDER — ALPRAZOLAM 0.25 MG PO TABS
0.2500 mg | ORAL_TABLET | Freq: Every day | ORAL | 2 refills | Status: DC | PRN
  Filled 2024-01-09: qty 30, 30d supply, fill #0
  Filled 2024-03-03: qty 30, 30d supply, fill #1
  Filled 2024-04-24: qty 30, 30d supply, fill #2

## 2024-01-09 MED ORDER — AZITHROMYCIN 250 MG PO TABS
ORAL_TABLET | ORAL | 0 refills | Status: AC
  Filled 2024-01-09: qty 6, 5d supply, fill #0

## 2024-01-09 MED ORDER — PREDNISONE 50 MG PO TABS
50.0000 mg | ORAL_TABLET | Freq: Every day | ORAL | 0 refills | Status: AC
Start: 2024-01-09 — End: 2024-01-15
  Filled 2024-01-09: qty 5, 5d supply, fill #0

## 2024-03-03 ENCOUNTER — Other Ambulatory Visit (HOSPITAL_COMMUNITY): Payer: Self-pay

## 2024-03-06 ENCOUNTER — Other Ambulatory Visit: Payer: Self-pay | Admitting: Obstetrics

## 2024-03-06 DIAGNOSIS — Z1231 Encounter for screening mammogram for malignant neoplasm of breast: Secondary | ICD-10-CM

## 2024-03-19 ENCOUNTER — Ambulatory Visit
Admission: RE | Admit: 2024-03-19 | Discharge: 2024-03-19 | Disposition: A | Discharge status: Home / Self Care | Source: Ambulatory Visit | Attending: Obstetrics | Admitting: Obstetrics

## 2024-03-19 DIAGNOSIS — Z1231 Encounter for screening mammogram for malignant neoplasm of breast: Secondary | ICD-10-CM

## 2024-04-28 ENCOUNTER — Other Ambulatory Visit: Payer: Self-pay

## 2024-04-30 ENCOUNTER — Other Ambulatory Visit (HOSPITAL_COMMUNITY): Payer: Self-pay

## 2024-04-30 MED ORDER — GABAPENTIN 100 MG PO CAPS
100.0000 mg | ORAL_CAPSULE | Freq: Three times a day (TID) | ORAL | 0 refills | Status: AC | PRN
  Filled 2024-04-30: qty 90, 30d supply, fill #0

## 2024-06-30 ENCOUNTER — Other Ambulatory Visit (HOSPITAL_COMMUNITY): Payer: Self-pay

## 2024-06-30 MED ORDER — ALPRAZOLAM 0.25 MG PO TABS
0.2500 mg | ORAL_TABLET | Freq: Every day | ORAL | 1 refills | Status: DC | PRN
  Filled 2024-06-30: qty 30, 30d supply, fill #0
  Filled 2024-08-11 (×2): qty 30, 30d supply, fill #1

## 2024-08-11 ENCOUNTER — Other Ambulatory Visit (HOSPITAL_COMMUNITY): Payer: Self-pay

## 2024-10-02 ENCOUNTER — Other Ambulatory Visit (HOSPITAL_COMMUNITY): Payer: Self-pay

## 2024-10-02 MED ORDER — ALPRAZOLAM 0.25 MG PO TABS
0.2500 mg | ORAL_TABLET | Freq: Every day | ORAL | 1 refills | Status: AC | PRN
  Filled 2024-10-02: qty 30, 30d supply, fill #0
  Filled 2024-11-21: qty 30, 30d supply, fill #1

## 2024-10-13 ENCOUNTER — Other Ambulatory Visit (HOSPITAL_COMMUNITY): Payer: Self-pay

## 2024-10-13 MED ORDER — PREDNISONE 50 MG PO TABS
50.0000 mg | ORAL_TABLET | Freq: Every day | ORAL | 0 refills | Status: AC
  Filled 2024-10-13: qty 5, 5d supply, fill #0

## 2024-11-21 ENCOUNTER — Other Ambulatory Visit (HOSPITAL_COMMUNITY): Payer: Self-pay
